# Patient Record
Sex: Male | Born: 1990
Health system: Southern US, Community
[De-identification: ages and names within clinical notes are randomized; demographics above are authoritative.]

## PROBLEM LIST (undated history)

## (undated) HISTORY — PX: SHOULDER SURGERY: SHX246

---
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
## 2012-01-18 ENCOUNTER — Emergency Department (HOSPITAL_COMMUNITY)
Admission: EM | Admit: 2012-01-18 | Discharge: 2012-01-18 | Disposition: A | Discharge status: Home / Self Care | Payer: Self-pay | Attending: Emergency Medicine | Admitting: Emergency Medicine

## 2012-01-18 ENCOUNTER — Encounter (HOSPITAL_COMMUNITY): Payer: Self-pay

## 2012-01-18 DIAGNOSIS — T622X1A Toxic effect of other ingested (parts of) plant(s), accidental (unintentional), initial encounter: Secondary | ICD-10-CM | POA: Insufficient documentation

## 2012-01-18 DIAGNOSIS — L239 Allergic contact dermatitis, unspecified cause: Secondary | ICD-10-CM

## 2012-01-18 DIAGNOSIS — L255 Unspecified contact dermatitis due to plants, except food: Secondary | ICD-10-CM | POA: Insufficient documentation

## 2012-01-18 MED ORDER — FAMOTIDINE 20 MG PO TABS: 20.0000 mg | ORAL_TABLET | Freq: Two times a day (BID) | ORAL | Status: AC

## 2012-01-18 MED ORDER — DIPHENHYDRAMINE HCL 12.5 MG/5ML PO ELIX: 25.0000 mg | ORAL_SOLUTION | Freq: Once | ORAL | Status: DC

## 2012-01-18 MED ORDER — FAMOTIDINE IN NACL 20-0.9 MG/50ML-% IV SOLN
20.0000 mg | Freq: Once | INTRAVENOUS | Status: AC
  Administered 2012-01-18: 20 mg via INTRAVENOUS
  Filled 2012-01-18: qty 50

## 2012-01-18 MED ORDER — SODIUM CHLORIDE 0.9 % IV SOLN
Freq: Once | INTRAVENOUS | Status: AC
  Administered 2012-01-18: 10:00:00 via INTRAVENOUS

## 2012-01-18 MED ORDER — PREDNISONE 10 MG PO TABS: 20.0000 mg | ORAL_TABLET | Freq: Every day | ORAL | Status: AC

## 2012-01-18 MED ORDER — METHYLPREDNISOLONE SODIUM SUCC 125 MG IJ SOLR
125.0000 mg | Freq: Once | INTRAMUSCULAR | Status: AC
  Administered 2012-01-18: 125 mg via INTRAVENOUS
  Filled 2012-01-18: qty 2

## 2012-01-18 MED ORDER — DIPHENHYDRAMINE HCL 50 MG/ML IJ SOLN
25.0000 mg | Freq: Once | INTRAMUSCULAR | Status: AC
  Administered 2012-01-18: 25 mg via INTRAVENOUS
  Filled 2012-01-18: qty 1

## 2012-01-18 NOTE — ED Notes (Signed)
Dr Zammit in room with pt and family 

## 2012-01-18 NOTE — ED Notes (Signed)
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 Pt c/o pain and itching,

## 2012-01-18 NOTE — ED Notes (Signed)
Poison ivy to body and eyes. Has been taking benadryl no relief

## 2012-01-18 NOTE — ED Provider Notes (Signed)
History  This chart was scribed for Benny Lennert, MD by Ladona Ridgel Day. This patient was seen in room APA12/[facility]12 and the patient's care was started at 0834.   CSN: [id number]  Arrival date & time 01/18/12  4098   First MD Initiated Contact with Patient 01/18/12 [phone]      Chief Complaint  Patient presents with  . Poison Ivy   Patient is a 21 y.o. male presenting with rash. The history is provided by the patient. No language interpreter was used.  Rash  This is a new problem. The current episode started 2 days ago. The problem has been gradually worsening. The problem is associated with plant contact. There has been no fever. The rash is present on the face, abdomen, trunk, right arm and left arm. The pain is mild. The pain has been constant since onset. Associated symptoms include itching. Treatments tried: benadryl. The treatment provided no relief.   Marcus Fitzpatrick is a 21 y.o. 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 He has been taking benadryl without any relief.  History reviewed. No pertinent past medical history.  Past Surgical History  Procedure Date  . Shoulder surgery     No family history on file.  History  Substance Use Topics  . Smoking status: Never Smoker   . Smokeless tobacco: Not on file  . Alcohol Use: No      Review of Systems  Constitutional: Negative for fatigue.  HENT: Negative for congestion, sinus pressure and ear discharge.   Eyes: Negative for discharge.  Respiratory: Negative for cough.   Cardiovascular: Negative for chest pain.  Gastrointestinal: Negative for abdominal pain and diarrhea.  Genitourinary: Negative for frequency and hematuria.  Musculoskeletal: Negative for back pain.  Skin: Positive for itching and  rash (itchy red rash over his face, trunk and arms).  Neurological: Negative for seizures and headaches.  Hematological: Negative.   Psychiatric/Behavioral: Negative for hallucinations.  All other systems reviewed and are negative.    Allergies  Review of patient's allergies indicates no known allergies.  Home Medications   Current Outpatient Rx  Name Route Sig Dispense Refill  . DIPHENHYDRAMINE HCL 25 MG PO TABS Oral Take 25 mg by mouth every 6 (six) hours as needed. Itching    . PRAMOXINE-CALAMINE 1-8 % EX LOTN Topical Apply 1 application topically 2 (two) times daily as needed. Itching    . ADULT MULTIVITAMIN W/MINERALS CH Oral Take 1 tablet by mouth daily.      Triage Vitals: BP 146/91  Pulse 86  Temp 98.3 F (36.8 C) (Oral)  Resp 20  Ht 5\' 9"  (1.753 m)  Wt 215 lb (97.523 kg)  BMI 31.75 kg/m2  SpO2 99%  Physical Exam  Nursing note and vitals reviewed. Constitutional: He is oriented to person, place, and time. He appears well-developed.  HENT:  Head: Normocephalic.  Eyes: Conjunctivae normal are normal.  Neck: No tracheal deviation present.  Cardiovascular:  No murmur heard. Musculoskeletal: Normal range of motion.  Neurological: He is oriented to person, place, and time.  Skin: Skin is warm.       Allergic crusting rash over face, chest, and arms. Swelling around face and bilateral eyes.    Psychiatric: He has a normal mood and  affect.    ED Course  Procedures (including critical care time) DIAGNOSTIC STUDIES: Oxygen Saturation is 99% on room air, normal by my interpretation.    COORDINATION OF CARE: At 940 AM Discussed treatment plan with patient which includes pepcid, solu-medrol, benadryl. Patient agrees.   Labs Reviewed - No data to display No results found.   No diagnosis found.    MDM  The chart was scribed for me under my direct supervision.  I personally performed the history, physical, and medical decision making and all procedures in the  evaluation of this patient.Benny Lennert, MD 01/18/12 [phone]

## 2012-04-24 IMAGING — CR DG SHOULDER 2+V*L*
1 series · 1 of 1 positions shown · non-contrast
Comparison: Shoulder series 12/05/2009

CLINICAL DATA: Shoulder injury.  Question posterior dislocation.

LEFT SHOULDER - 2+ VIEW

[view not recorded]
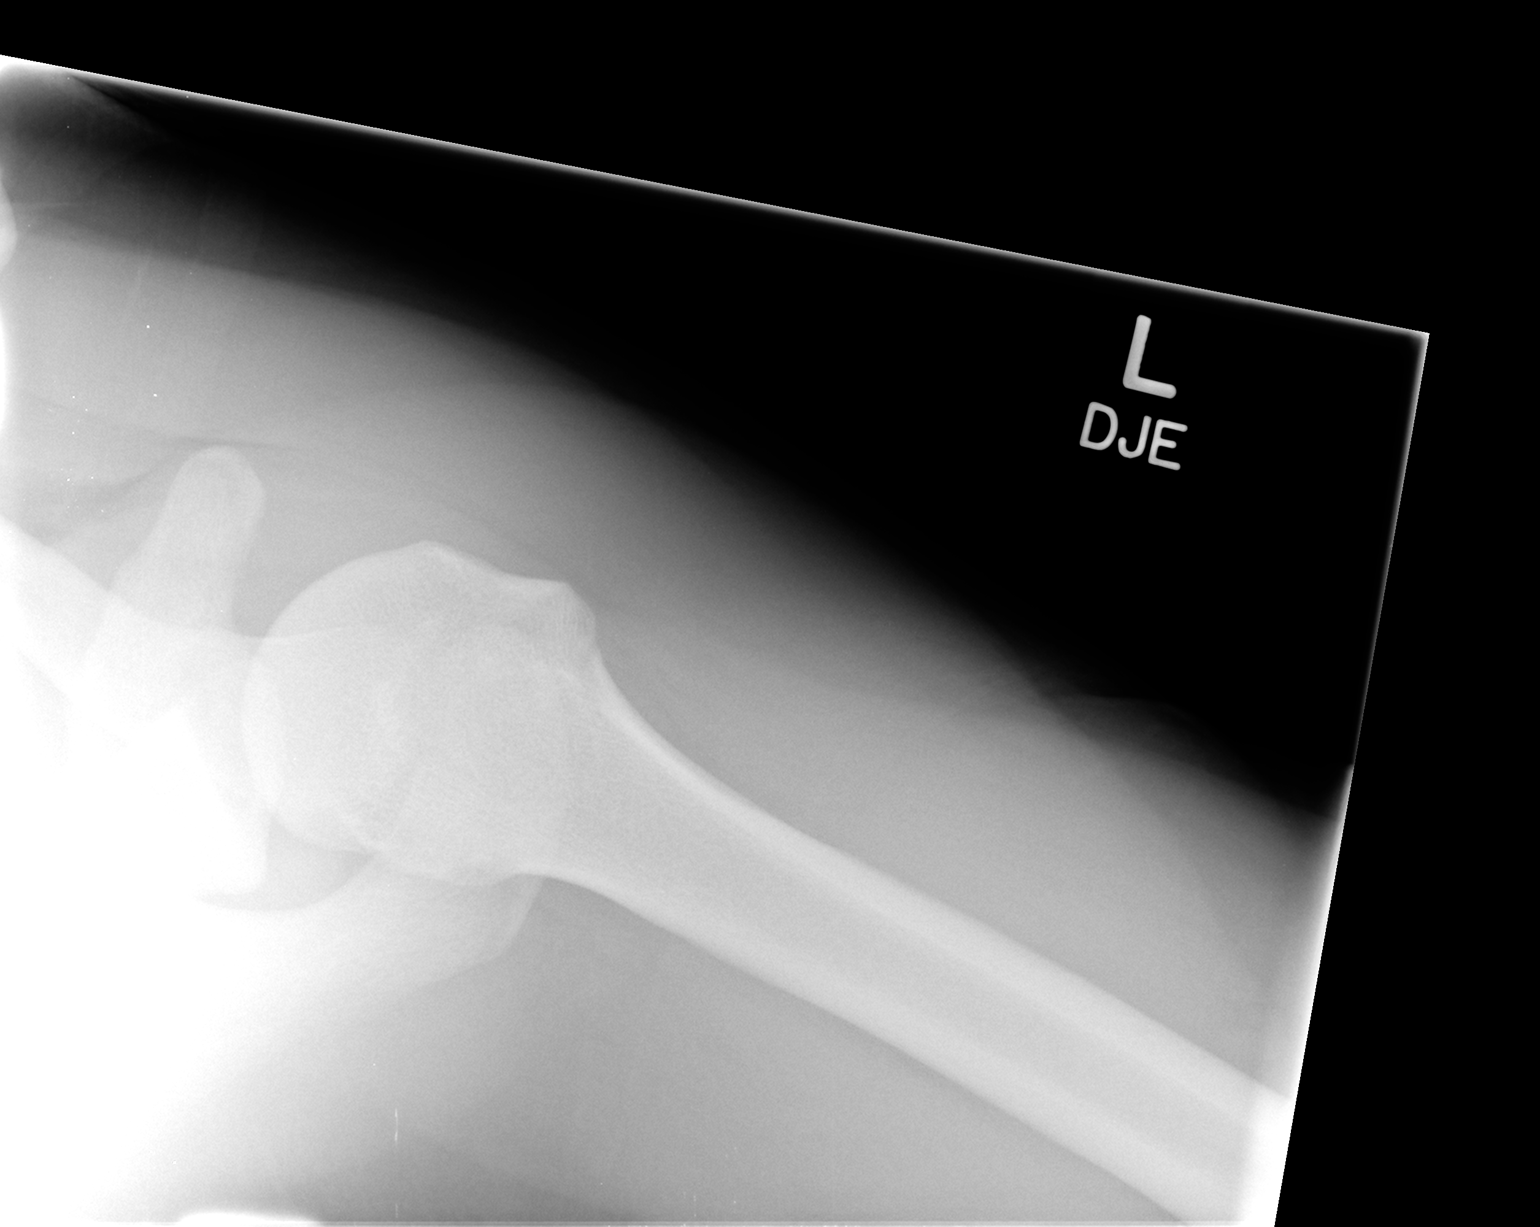

[1 of 1 positions shown; findings below may reference images not displayed]

FINDINGS: Axillary view demonstrates no evidence of dislocation.
IMPRESSION: No evidence of posterior dislocation.

## 2019-05-28 DIAGNOSIS — L400 Psoriasis vulgaris: Secondary | ICD-10-CM | POA: Diagnosis not present

## 2019-09-08 ENCOUNTER — Other Ambulatory Visit: Payer: Self-pay

## 2019-09-08 ENCOUNTER — Encounter: Payer: Self-pay | Admitting: Emergency Medicine

## 2019-09-08 ENCOUNTER — Ambulatory Visit
Admission: EM | Admit: 2019-09-08 | Discharge: 2019-09-08 | Disposition: A | Discharge status: Home / Self Care | Payer: BC Managed Care – PPO | Attending: Emergency Medicine | Admitting: Emergency Medicine

## 2019-09-08 DIAGNOSIS — Z1152 Encounter for screening for COVID-19: Secondary | ICD-10-CM

## 2019-09-08 DIAGNOSIS — J029 Acute pharyngitis, unspecified: Secondary | ICD-10-CM | POA: Diagnosis not present

## 2019-09-08 DIAGNOSIS — H6592 Unspecified nonsuppurative otitis media, left ear: Secondary | ICD-10-CM

## 2019-09-08 DIAGNOSIS — J069 Acute upper respiratory infection, unspecified: Secondary | ICD-10-CM

## 2019-09-08 LAB — POCT INFLUENZA A/B
Influenza A, POC: NEGATIVE
Influenza B, POC: NEGATIVE

## 2019-09-08 LAB — POCT RAPID STREP A (OFFICE): Rapid Strep A Screen: NEGATIVE

## 2019-09-08 MED ORDER — MENTHOL 3 MG MT LOZG: 1.0000 | LOZENGE | OROMUCOSAL | 1 refills | Status: AC | PRN

## 2019-09-08 MED ORDER — CETIRIZINE HCL 10 MG PO TABS: 10.0000 mg | ORAL_TABLET | Freq: Every day | ORAL | 0 refills | Status: AC

## 2019-09-08 MED ORDER — FLUTICASONE PROPIONATE 50 MCG/ACT NA SUSP: 1.0000 | Freq: Every day | NASAL | 0 refills | Status: AC

## 2019-09-08 NOTE — ED Provider Notes (Addendum)
[facility]    CSN: [id number] Arrival date & time: 09/08/19  5638      History   Chief Complaint Chief Complaint  Patient presents with  . Sore Throat    HPI Marcus Fitzpatrick is a 29 y.o. male.   Who presented to the urgent care for complaint of fatigue, congestion, sore throat, ear pain for the past 4 days.  Denies sick exposure to COVID, flu or strep.  Denies recent travel.  Denies aggravating or alleviating symptoms.  Denies previous COVID infection.   Denies fever, chills,  rhinorrhea, cough, SOB, wheezing, chest pain, nausea, vomiting, changes in bowel or bladder habits.    The history is provided by the patient. No language interpreter was used.  Sore Throat    History reviewed. No pertinent past medical history.  Patient Active Problem List   Diagnosis Date Noted  . SHOULDER DISLOCATION, RECURRENT 07/21/2009    Past Surgical History:  Procedure Laterality Date  . SHOULDER SURGERY         Home Medications    Prior to Admission medications   Medication Sig Start Date End Date Taking? Authorizing Provider  cetirizine (ZYRTEC ALLERGY) 10 MG tablet Take 1 tablet (10 mg total) by mouth daily. 09/08/19   Rynlee Lisbon, Zachery Dakins, FNP  diphenhydrAMINE (BENADRYL) 25 MG tablet Take 25 mg by mouth every 6 (six) hours as needed. Itching    [provider]  diphenhydramine-calamine (CALADRYL) 1-8 % LOTN Apply 1 application topically 2 (two) times daily as needed. Itching    [provider]  famotidine (PEPCID) 20 MG tablet Take 1 tablet (20 mg total) by mouth 2 (two) times daily. 01/18/12   Bethann Berkshire, MD  fluticasone (FLONASE) 50 MCG/ACT nasal spray Place 1 spray into both nostrils daily for 14 days. 09/08/19 09/22/19  Kyelle Urbas, Zachery Dakins, FNP  menthol-cetylpyridinium (CEPACOL) 3 MG lozenge Take 1 lozenge (3 mg total) by mouth as needed for sore throat. 09/08/19   Quintel Mccalla, Zachery Dakins, FNP  Multiple Vitamin (MULTIVITAMIN WITH MINERALS) TABS Take 1 tablet  by mouth daily.    [provider]  predniSONE (DELTASONE) 10 MG tablet Take 2 tablets (20 mg total) by mouth daily. Patient not taking: Reported on 09/08/2019 01/18/12   Bethann Berkshire, MD    Family History History reviewed. No pertinent family history.  Social History Social History   Tobacco Use  . Smoking status: Never Smoker  Substance Use Topics  . Alcohol use: No  . Drug use: No     Allergies   Patient has no known allergies.   Review of Systems Review of Systems  Constitutional: Positive for fatigue.  HENT: Positive for congestion, ear pain and sore throat.   Respiratory: Negative.   Cardiovascular: Negative.   Gastrointestinal: Negative.   Neurological: Negative.   All other systems reviewed and are negative.    Physical Exam Triage Vital Signs ED Triage Vitals  Enc Vitals Group     BP 09/08/19 0830 137/82     Pulse Rate 09/08/19 0830 (!) 115     Resp 09/08/19 0830 16     Temp 09/08/19 0830 98.7 F (37.1 C)     Temp Source 09/08/19 0830 Oral     SpO2 09/08/19 0830 98 %     Weight --      Height --      Head Circumference --      Peak Flow --      Pain Score 09/08/19 0841 7  Pain Loc --      Pain Edu? --      Excl. in [place]? --    No data found.  Updated Vital Signs BP 137/82 (BP Location: Right Arm)   Pulse (!) 115   Temp 98.7 F (37.1 C) (Oral)   Resp 16   SpO2 98%   Visual Acuity Right Eye Distance:   Left Eye Distance:   Bilateral Distance:    Right Eye Near:   Left Eye Near:    Bilateral Near:     Physical Exam Vitals and nursing note reviewed.  Constitutional:      General: He is not in acute distress.    Appearance: Normal appearance. He is normal weight. He is not ill-appearing or toxic-appearing.  HENT:     Head: Normocephalic.     Right Ear: Tympanic membrane, ear canal and external ear normal. There is no impacted cerumen.     Left Ear: Ear canal and external ear normal. A middle ear effusion is present. There  is no impacted cerumen.     Nose: Nose normal. No congestion.     Mouth/Throat:     Mouth: Mucous membranes are moist.     Pharynx: Oropharynx is clear. No oropharyngeal exudate or posterior oropharyngeal erythema.     Tonsils: 1+ on the right. 1+ on the left.  Cardiovascular:     Rate and Rhythm: Normal rate and regular rhythm.     Pulses: Normal pulses.     Heart sounds: Normal heart sounds. No murmur. No friction rub. No gallop.   Pulmonary:     Effort: Pulmonary effort is normal. No respiratory distress.     Breath sounds: Normal breath sounds. No stridor. No wheezing or rhonchi.  Chest:     Chest wall: No tenderness.  Neurological:     Mental Status: He is alert and oriented to person, place, and time.      UC Treatments / Results  Labs (all labs ordered are listed, but only abnormal results are displayed) Labs Reviewed  CULTURE, GROUP A STREP ([place])  NOVEL CORONAVIRUS, NAA  POCT RAPID STREP A (OFFICE)  POCT INFLUENZA A/B    EKG   Radiology No results found.  Procedures Procedures (including critical care time)  Medications Ordered in UC Medications - No data to display  Initial Impression / Assessment and Plan / UC Course  I have reviewed the triage vital signs and the nursing notes.  Pertinent labs & imaging results that were available during my care of the patient were reviewed by me and considered in my medical decision making (see chart for details).    Patient is stable for discharge.  POCT strep test and flu were negative.   COVID-19 test was completed.  Flonase, Zyrtec, Cepacol lozenges were prescribed for symptomatic relief.  He was advised to go to ED for worsening of symptoms.  Final Clinical Impressions(s) / UC Diagnoses   Final diagnoses:  Sore throat  Viral URI  Fluid level behind tympanic membrane of left ear  Encounter for screening for COVID-19     Discharge Instructions     POCT strep test and influenza test were negative Flonase  was prescribed for congestion Zyrtec for sore throat and congestion Cepacol lozenges for sore throat  COVID testing ordered.  It will take between 2-7 days for test results.  Someone will contact you regarding abnormal results.    In the meantime: You should remain isolated in your home for 10 days  from symptom onset AND greater than 24  hours after symptoms resolution (absence of fever without the use of fever-reducing medication and improvement in respiratory symptoms), whichever is longer Get plenty of rest and push fluids Use medications daily for symptom relief Use OTC medications like ibuprofen or tylenol as needed fever or pain Call or go to the ED if you have any new or worsening symptoms such as fever, worsening cough, shortness of breath, chest tightness, chest pain, turning blue, changes in mental status, etc...     ED Prescriptions    Medication Sig Dispense Auth. Provider   cetirizine (ZYRTEC ALLERGY) 10 MG tablet Take 1 tablet (10 mg total) by mouth daily. 30 tablet Henry Demeritt S, FNP   fluticasone (FLONASE) 50 MCG/ACT nasal spray Place 1 spray into both nostrils daily for 14 days. 16 g Jabreel Chimento, Zachery Dakins, FNP   menthol-cetylpyridinium (CEPACOL) 3 MG lozenge Take 1 lozenge (3 mg total) by mouth as needed for sore throat. 100 tablet Marty Sadlowski, Zachery Dakins, FNP     PDMP not reviewed this encounter.   Durward Parcel, FNP 09/08/19 0912    Durward Parcel, FNP 09/08/19 [phone]

## 2019-09-08 NOTE — ED Triage Notes (Signed)
Pt sts fatigue, sore throat and ear pain  With head congestion x 4 days; denies fever; pt declined covid testing at this time

## 2019-09-08 NOTE — Discharge Instructions (Addendum)
POCT strep test and influenza test were negative Flonase was prescribed for congestion Zyrtec for sore throat and congestion Cepacol lozenges for sore throat  COVID testing ordered.  It will take between 2-7 days for test results.  Someone will contact you regarding abnormal results.    In the meantime: You should remain isolated in your home for 10 days from symptom onset AND greater than 24  hours after symptoms resolution (absence of fever without the use of fever-reducing medication and improvement in respiratory symptoms), whichever is longer Get plenty of rest and push fluids Use medications daily for symptom relief Use OTC medications like ibuprofen or tylenol as needed fever or pain Call or go to the ED if you have any new or worsening symptoms such as fever, worsening cough, shortness of breath, chest tightness, chest pain, turning blue, changes in mental status, etc..Marland Kitchen

## 2019-09-09 LAB — SARS-COV-2, NAA 2 DAY TAT

## 2019-09-09 LAB — NOVEL CORONAVIRUS, NAA: SARS-CoV-2, NAA: NOT DETECTED

## 2019-09-11 LAB — CULTURE, GROUP A STREP (THRC)

## 2019-11-20 DIAGNOSIS — L400 Psoriasis vulgaris: Secondary | ICD-10-CM | POA: Diagnosis not present

## 2019-11-20 DIAGNOSIS — B078 Other viral warts: Secondary | ICD-10-CM | POA: Diagnosis not present

## 2020-01-31 DIAGNOSIS — Z1331 Encounter for screening for depression: Secondary | ICD-10-CM | POA: Diagnosis not present

## 2020-01-31 DIAGNOSIS — Z0001 Encounter for general adult medical examination with abnormal findings: Secondary | ICD-10-CM | POA: Diagnosis not present

## 2020-01-31 DIAGNOSIS — Z6837 Body mass index (BMI) 37.0-37.9, adult: Secondary | ICD-10-CM | POA: Diagnosis not present

## 2020-01-31 DIAGNOSIS — L409 Psoriasis, unspecified: Secondary | ICD-10-CM | POA: Diagnosis not present

## 2021-04-15 ENCOUNTER — Other Ambulatory Visit: Payer: Self-pay

## 2021-04-15 ENCOUNTER — Other Ambulatory Visit (HOSPITAL_COMMUNITY): Payer: Self-pay | Admitting: Family Medicine

## 2021-04-15 ENCOUNTER — Ambulatory Visit (HOSPITAL_COMMUNITY)
Admission: RE | Admit: 2021-04-15 | Discharge: 2021-04-15 | Disposition: A | Discharge status: Home / Self Care | Payer: BC Managed Care – PPO | Source: Ambulatory Visit | Attending: Family Medicine | Admitting: Family Medicine

## 2021-04-15 DIAGNOSIS — M25512 Pain in left shoulder: Secondary | ICD-10-CM

## 2021-04-15 DIAGNOSIS — Z6841 Body Mass Index (BMI) 40.0 and over, adult: Secondary | ICD-10-CM | POA: Diagnosis not present

## 2021-04-15 DIAGNOSIS — Z1331 Encounter for screening for depression: Secondary | ICD-10-CM | POA: Diagnosis not present

## 2021-05-08 DIAGNOSIS — Z3141 Encounter for fertility testing: Secondary | ICD-10-CM | POA: Diagnosis not present

## 2023-09-02 IMAGING — DX DG SHOULDER 2+V*L*
3 series · 3 of 3 positions shown · non-contrast
Comparison: 12/06/2009

CLINICAL DATA: Shoulder pain

EXAM:
LEFT SHOULDER - 2+ VIEW

[shoulder grashey]
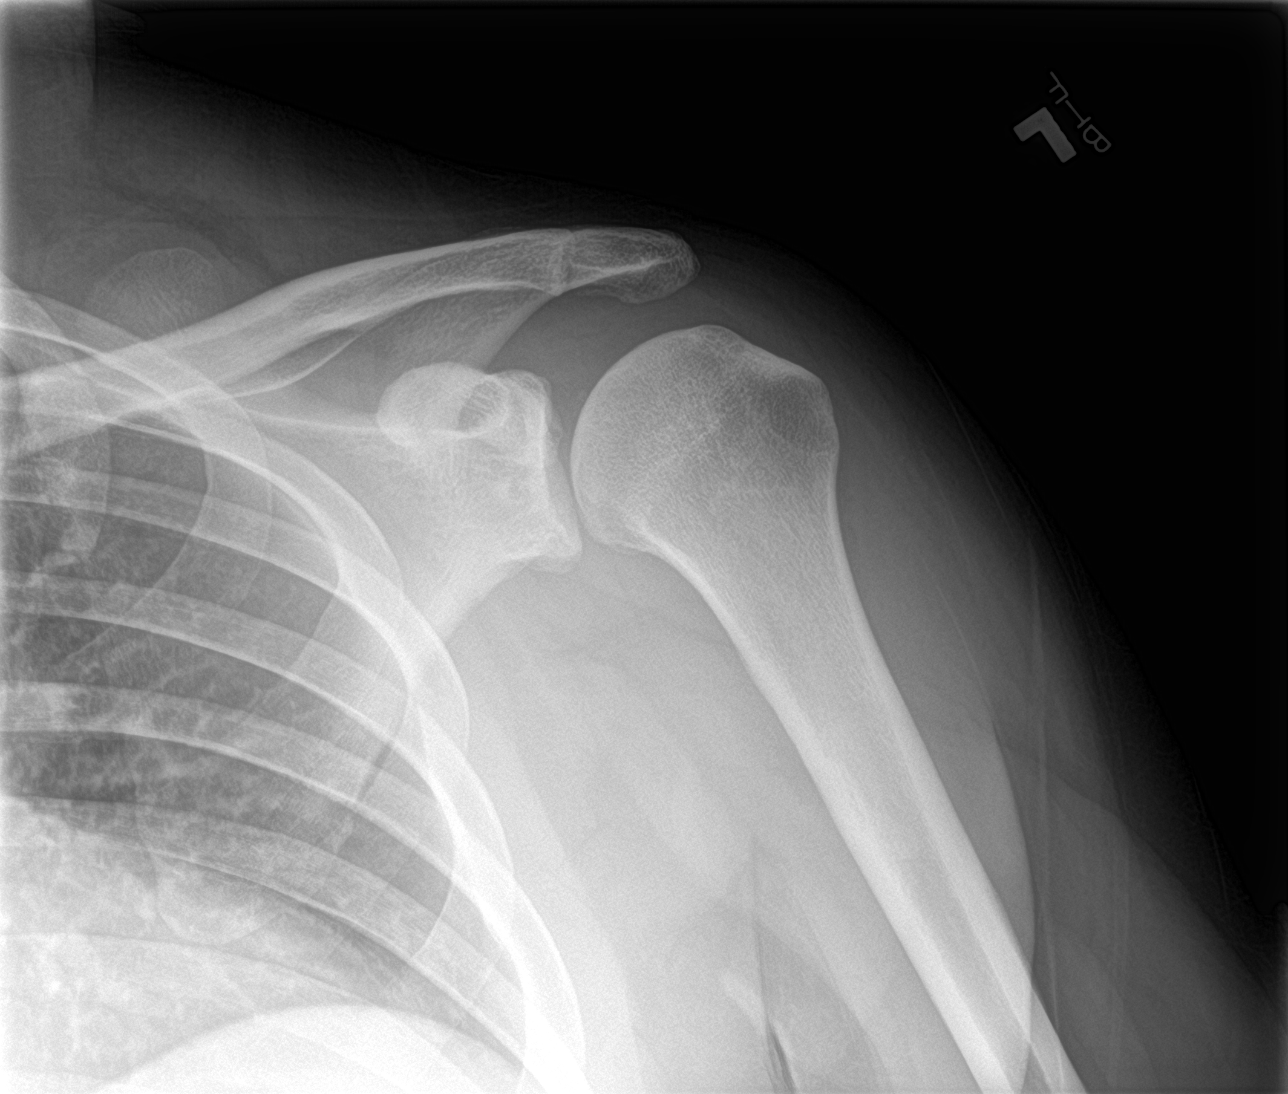

[shoulder y view]
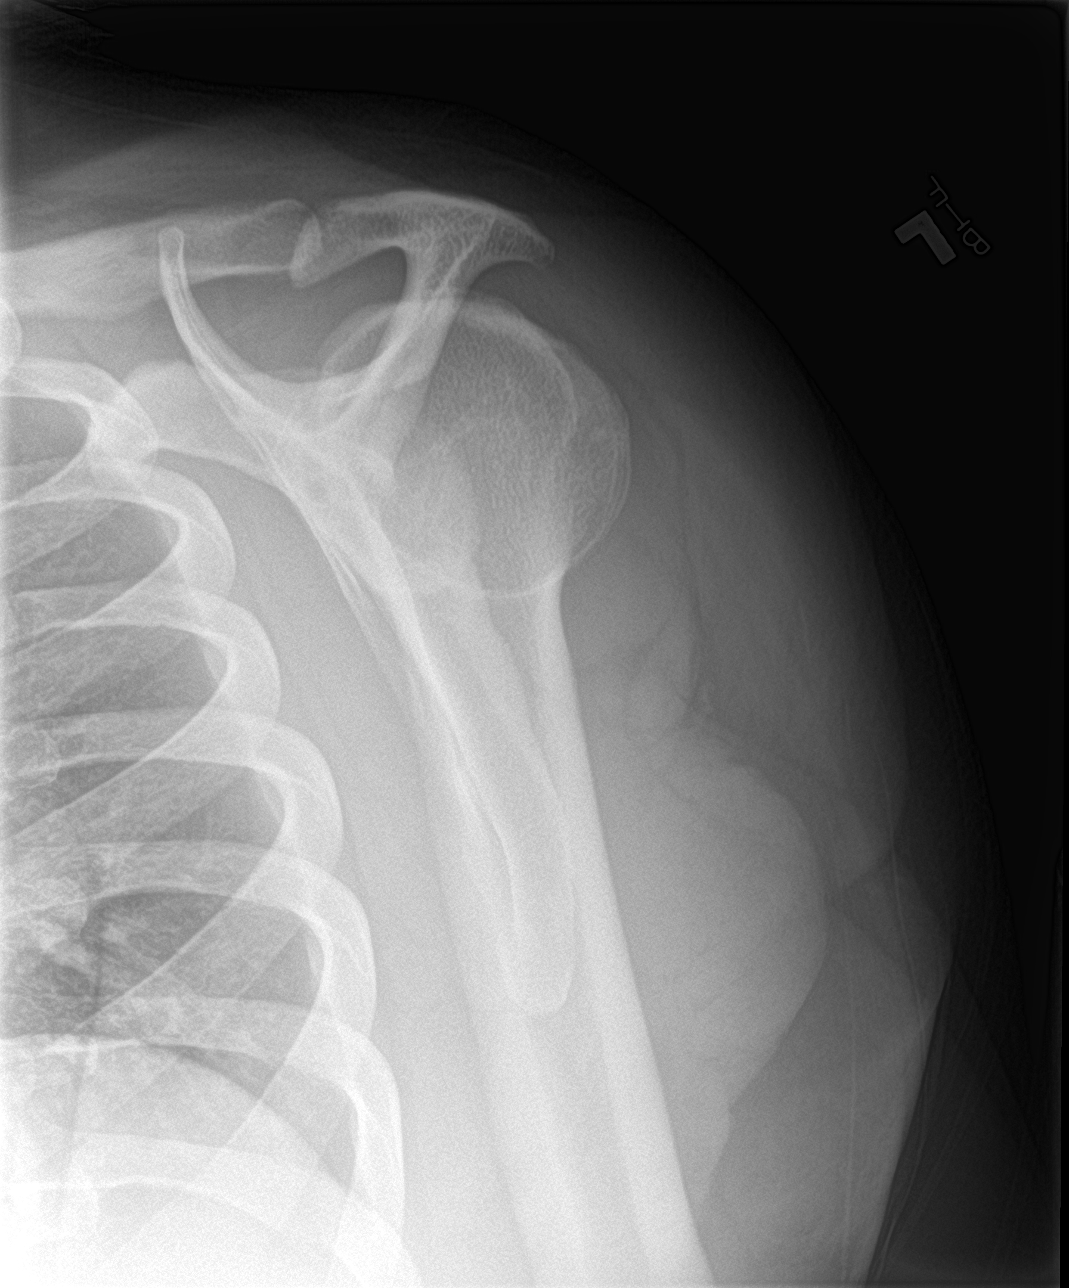

[shoulder axillary]
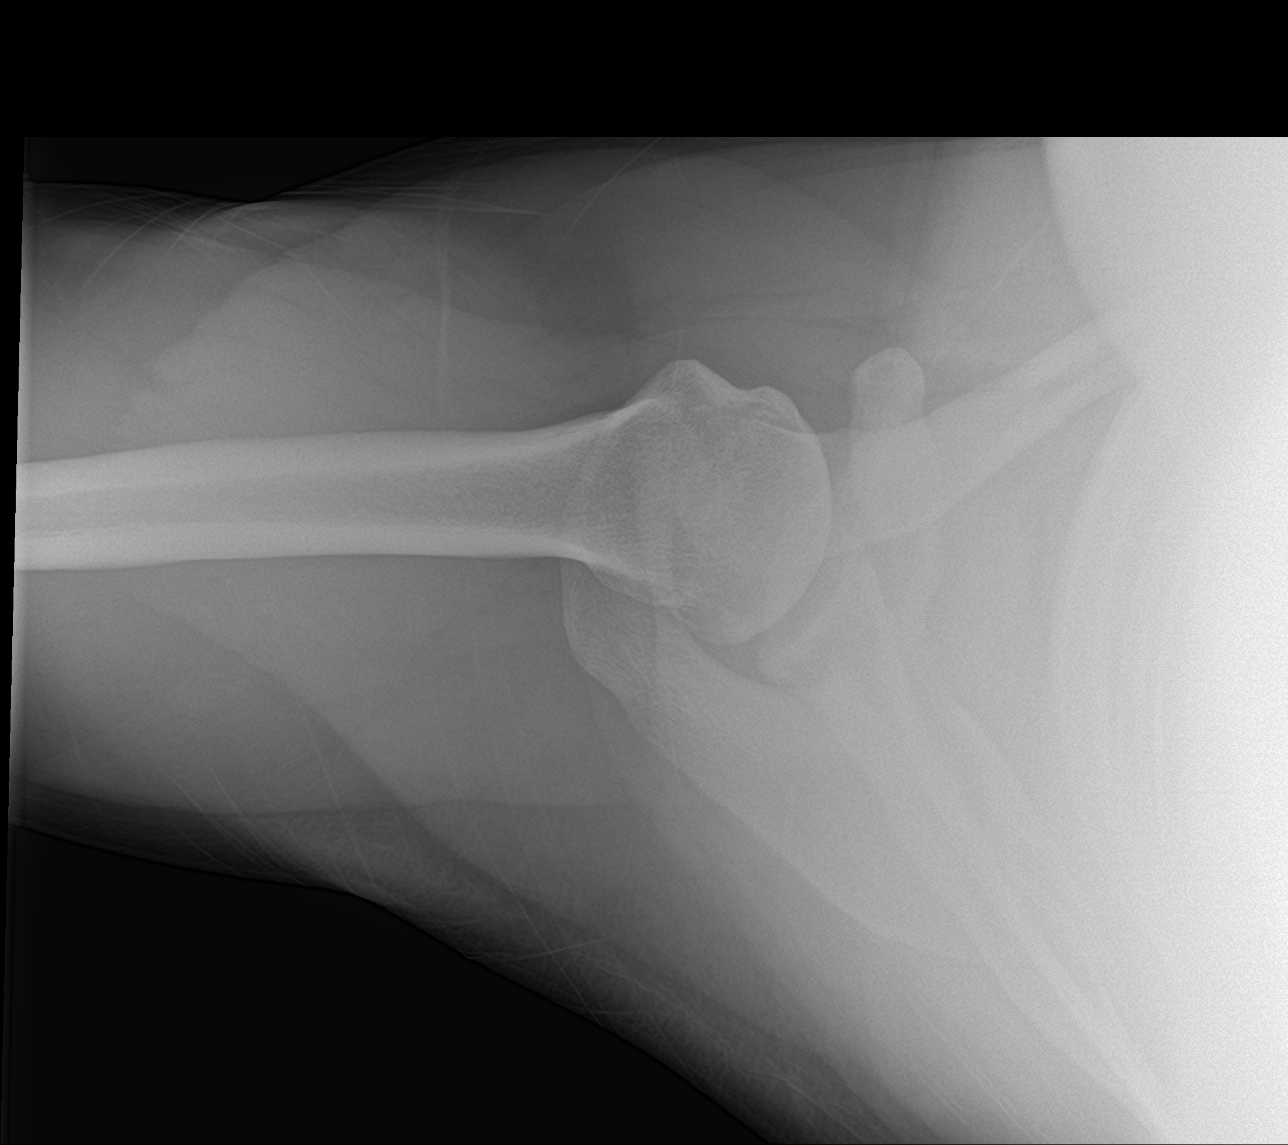

[3 of 3 positions shown; findings below may reference images not displayed]

FINDINGS: There is no evidence of fracture or dislocation. There is no
evidence of arthropathy or other focal bone abnormality. Soft
tissues are unremarkable.
IMPRESSION: Negative.
